# Patient Record
Sex: Male | Born: 2005 | Race: White | Hispanic: No | Marital: Single
Health system: Southern US, Community
[De-identification: ages and names within clinical notes are randomized; demographics above are authoritative.]

---
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
## 2024-03-13 ENCOUNTER — Ambulatory Visit: Payer: Self-pay | Admitting: Sports Medicine
